# Patient Record
Sex: Female | Born: 1965 | Race: White | Hispanic: No | Marital: Married
Health system: Southern US, Community
[De-identification: ages and names within clinical notes are randomized; demographics above are authoritative.]

---

## 1998-01-11 ENCOUNTER — Inpatient Hospital Stay (HOSPITAL_COMMUNITY): Admission: AD | Admit: 1998-01-11 | Discharge: 1998-01-13 | Payer: Self-pay | Admitting: Obstetrics and Gynecology

## 1998-02-17 ENCOUNTER — Other Ambulatory Visit: Admission: RE | Admit: 1998-02-17 | Discharge: 1998-02-17 | Payer: Self-pay | Admitting: Obstetrics and Gynecology

## 1999-02-28 ENCOUNTER — Other Ambulatory Visit: Admission: RE | Admit: 1999-02-28 | Discharge: 1999-02-28 | Payer: Self-pay | Admitting: Obstetrics and Gynecology

## 2000-03-12 ENCOUNTER — Other Ambulatory Visit: Admission: RE | Admit: 2000-03-12 | Discharge: 2000-03-12 | Payer: Self-pay | Admitting: Obstetrics and Gynecology

## 2001-03-13 ENCOUNTER — Other Ambulatory Visit: Admission: RE | Admit: 2001-03-13 | Discharge: 2001-03-13 | Payer: Self-pay | Admitting: Obstetrics and Gynecology

## 2001-12-14 ENCOUNTER — Other Ambulatory Visit: Admission: RE | Admit: 2001-12-14 | Discharge: 2001-12-14 | Payer: Self-pay | Admitting: Obstetrics and Gynecology

## 2002-07-01 ENCOUNTER — Inpatient Hospital Stay (HOSPITAL_COMMUNITY): Admission: AD | Admit: 2002-07-01 | Discharge: 2002-07-03 | Payer: Self-pay | Admitting: Obstetrics and Gynecology

## 2002-08-11 ENCOUNTER — Other Ambulatory Visit: Admission: RE | Admit: 2002-08-11 | Discharge: 2002-08-11 | Payer: Self-pay | Admitting: Obstetrics and Gynecology

## 2003-08-08 ENCOUNTER — Other Ambulatory Visit: Admission: RE | Admit: 2003-08-08 | Discharge: 2003-08-08 | Payer: Self-pay | Admitting: Obstetrics and Gynecology

## 2006-09-10 ENCOUNTER — Encounter: Admission: RE | Admit: 2006-09-10 | Discharge: 2006-09-10 | Payer: Self-pay | Admitting: Obstetrics and Gynecology

## 2007-09-25 ENCOUNTER — Encounter: Admission: RE | Admit: 2007-09-25 | Discharge: 2007-09-25 | Payer: Self-pay | Admitting: Family Medicine

## 2008-09-26 ENCOUNTER — Encounter: Admission: RE | Admit: 2008-09-26 | Discharge: 2008-09-26 | Payer: Self-pay | Admitting: Family Medicine

## 2009-10-04 ENCOUNTER — Encounter: Admission: RE | Admit: 2009-10-04 | Discharge: 2009-10-04 | Payer: Self-pay | Admitting: Family Medicine

## 2010-08-17 NOTE — H&P (Signed)
   Courtney Weber, ZUMSTEIN                        ACCOUNT NO.:  [id number]   MEDICAL RECORD NO.:  [id number]                   PATIENT TYPE:  INP   LOCATION:  9174                                 FACILITY:  [facility]   PHYSICIAN:  Lenoard Aden, M.D.             DATE OF BIRTH:  01-08-1966   DATE OF ADMISSION:  07/01/2002  DATE OF DISCHARGE:                                HISTORY & PHYSICAL   CHIEF COMPLAINT:  History of shoulder dystocia and polyhydramnios for  induction.   HISTORY OF PRESENT ILLNESS:  The patient is a 45 year old white female G5,  P3, who presents for induction due to polyhydramnios, decreased fetal  movement and suspected macrosomia with a previous history of a mild shoulder  dystocia.   PAST MEDICAL HISTORY:  Remarkable for three previous vaginal deliveries in  1994, 1997 and 1999.  The patient had a mild to moderate shoulder dystocia  encountered in her 1997 delivery of an 8 pound 13 ounce child, previous  other children were 8 pounds 1 ounce and 8 pounds 9 ounces.   Otherwise her history is remarkable for ureterolithiasis at age 13 with  lithotripsy and history of advanced maternal age.  She declined  amniocentesis.   FAMILY HISTORY:  Thyroid dysfunction and adult onset diabetes.   PRENATAL LABORATORY DATA:  Blood type of A positive, Rh antibody negative.  Rubella immune.  Hepatitis B surface antigen negative.  Rubella negative.  HIV nonreactive.   PHYSICAL EXAMINATION:  GENERAL:  She is a well-developed, well-nourished  white female in no apparent distress.  HEENT:  Normal.  LUNGS:  Clear.  HEART:  Regular rhythm.  ABDOMEN:  Soft, gravid, nontender.  Estimated fetal weight is 8-1/2 to 9  pounds.  PELVIC:  Cervix is 2 cm dilated, vertex, and -2.  EXTREMITIES:  No cords.  NEUROLOGIC:  Nonfocal.   IMPRESSION:  1. Thirty-eight-week intrauterine pregnancy.  2. Presumed macrosomia with a history of shoulder dystocia.   PLAN:  Continue attempt at vaginal  delivery, IV Pitocin and amniotomy.                                               Lenoard Aden, M.D.    RJT/MEDQ  D:  07/01/2002  T:  07/01/2002  Job:  [id number]

## 2010-09-19 ENCOUNTER — Other Ambulatory Visit: Payer: Self-pay | Admitting: Family Medicine

## 2010-09-19 DIAGNOSIS — Z1231 Encounter for screening mammogram for malignant neoplasm of breast: Secondary | ICD-10-CM

## 2010-10-11 ENCOUNTER — Ambulatory Visit
Admission: RE | Admit: 2010-10-11 | Discharge: 2010-10-11 | Disposition: A | Discharge status: Home / Self Care | Payer: BC Managed Care – PPO | Source: Ambulatory Visit | Attending: Family Medicine | Admitting: Family Medicine

## 2010-10-11 DIAGNOSIS — Z1231 Encounter for screening mammogram for malignant neoplasm of breast: Secondary | ICD-10-CM

## 2011-09-17 ENCOUNTER — Other Ambulatory Visit: Payer: Self-pay | Admitting: Family Medicine

## 2011-09-17 DIAGNOSIS — Z1231 Encounter for screening mammogram for malignant neoplasm of breast: Secondary | ICD-10-CM

## 2011-10-24 ENCOUNTER — Ambulatory Visit
Admission: RE | Admit: 2011-10-24 | Discharge: 2011-10-24 | Disposition: A | Discharge status: Home / Self Care | Payer: BC Managed Care – PPO | Source: Ambulatory Visit | Attending: Family Medicine | Admitting: Family Medicine

## 2011-10-24 DIAGNOSIS — Z1231 Encounter for screening mammogram for malignant neoplasm of breast: Secondary | ICD-10-CM

## 2012-10-14 ENCOUNTER — Other Ambulatory Visit (HOSPITAL_BASED_OUTPATIENT_CLINIC_OR_DEPARTMENT_OTHER): Payer: Self-pay | Admitting: Family Medicine

## 2012-10-14 ENCOUNTER — Other Ambulatory Visit: Payer: Self-pay

## 2012-10-14 DIAGNOSIS — Z1231 Encounter for screening mammogram for malignant neoplasm of breast: Secondary | ICD-10-CM

## 2012-10-26 ENCOUNTER — Ambulatory Visit (HOSPITAL_BASED_OUTPATIENT_CLINIC_OR_DEPARTMENT_OTHER)
Admission: RE | Admit: 2012-10-26 | Discharge: 2012-10-26 | Disposition: A | Discharge status: Home / Self Care | Payer: BC Managed Care – PPO | Source: Ambulatory Visit | Attending: Family Medicine | Admitting: Family Medicine

## 2012-10-26 DIAGNOSIS — Z1231 Encounter for screening mammogram for malignant neoplasm of breast: Secondary | ICD-10-CM | POA: Insufficient documentation

## 2013-08-03 ENCOUNTER — Other Ambulatory Visit: Payer: Self-pay | Admitting: Obstetrics and Gynecology

## 2013-08-03 DIAGNOSIS — N6489 Other specified disorders of breast: Secondary | ICD-10-CM

## 2013-08-03 DIAGNOSIS — N6459 Other signs and symptoms in breast: Secondary | ICD-10-CM

## 2013-08-09 ENCOUNTER — Ambulatory Visit
Admission: RE | Admit: 2013-08-09 | Discharge: 2013-08-09 | Disposition: A | Discharge status: Home / Self Care | Payer: BC Managed Care – PPO | Source: Ambulatory Visit | Attending: Obstetrics and Gynecology | Admitting: Obstetrics and Gynecology

## 2013-08-09 DIAGNOSIS — N6489 Other specified disorders of breast: Secondary | ICD-10-CM

## 2013-08-12 ENCOUNTER — Other Ambulatory Visit: Payer: BC Managed Care – PPO

## 2013-08-18 ENCOUNTER — Other Ambulatory Visit: Payer: Self-pay

## 2013-08-18 DIAGNOSIS — Z1231 Encounter for screening mammogram for malignant neoplasm of breast: Secondary | ICD-10-CM

## 2013-11-11 ENCOUNTER — Ambulatory Visit
Admission: RE | Admit: 2013-11-11 | Discharge: 2013-11-11 | Disposition: A | Discharge status: Home / Self Care | Payer: BC Managed Care – PPO | Source: Ambulatory Visit

## 2013-11-11 ENCOUNTER — Encounter (INDEPENDENT_AMBULATORY_CARE_PROVIDER_SITE_OTHER): Payer: Self-pay

## 2013-11-11 DIAGNOSIS — Z1231 Encounter for screening mammogram for malignant neoplasm of breast: Secondary | ICD-10-CM

## 2016-01-18 IMAGING — MG MM SCREEN MAMMOGRAM BILATERAL
4 series · 4 of 4 positions shown · non-contrast
Comparison: Previous exam(s).

CLINICAL DATA: Screening. Family history of breast cancer in her
grandmother age 70.

EXAM:
DIGITAL SCREENING BILATERAL MAMMOGRAM WITH CAD

[R CC]
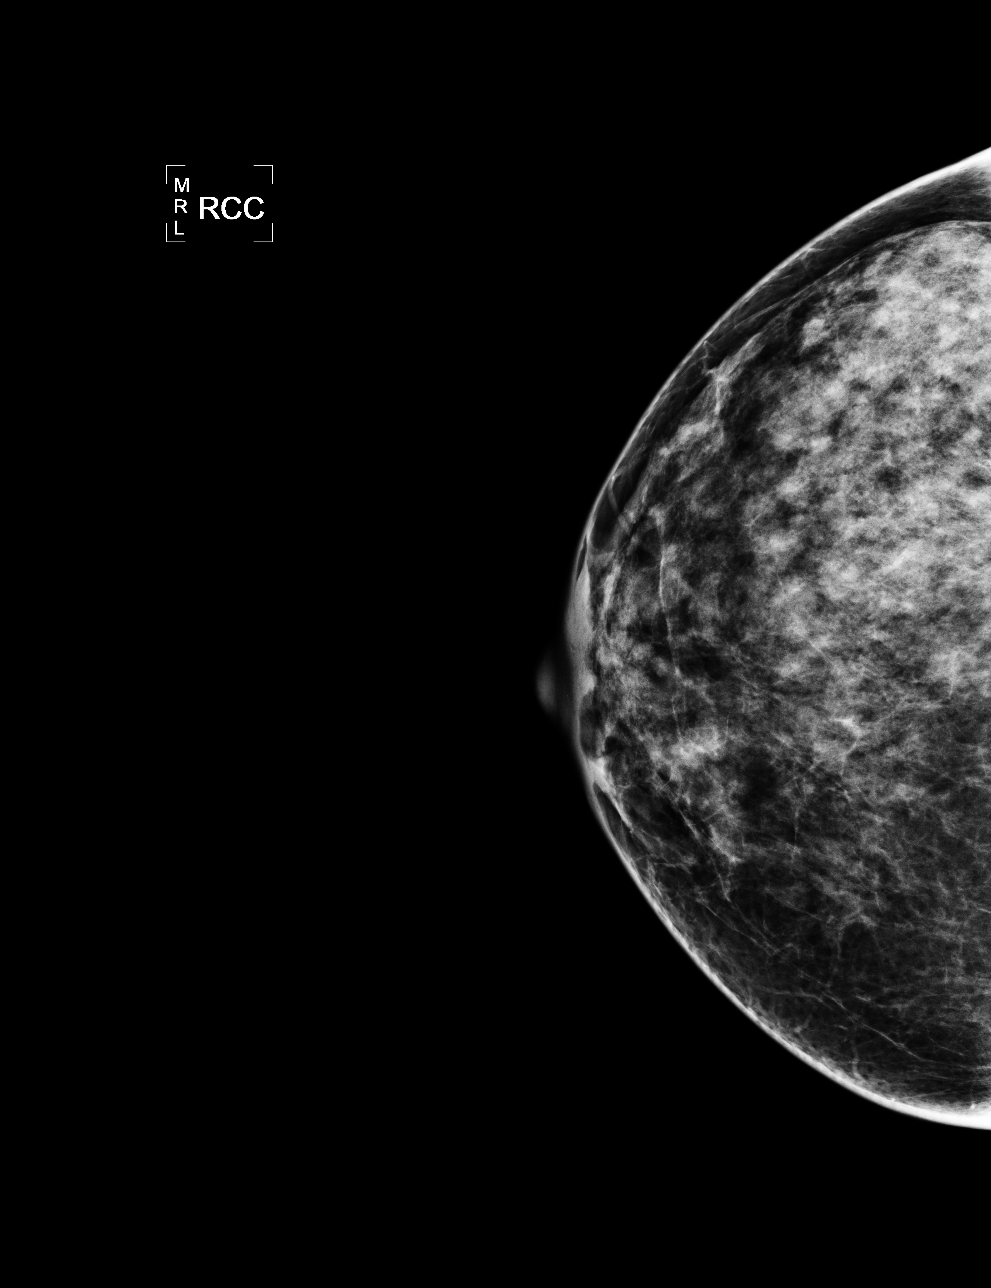

[L CC]
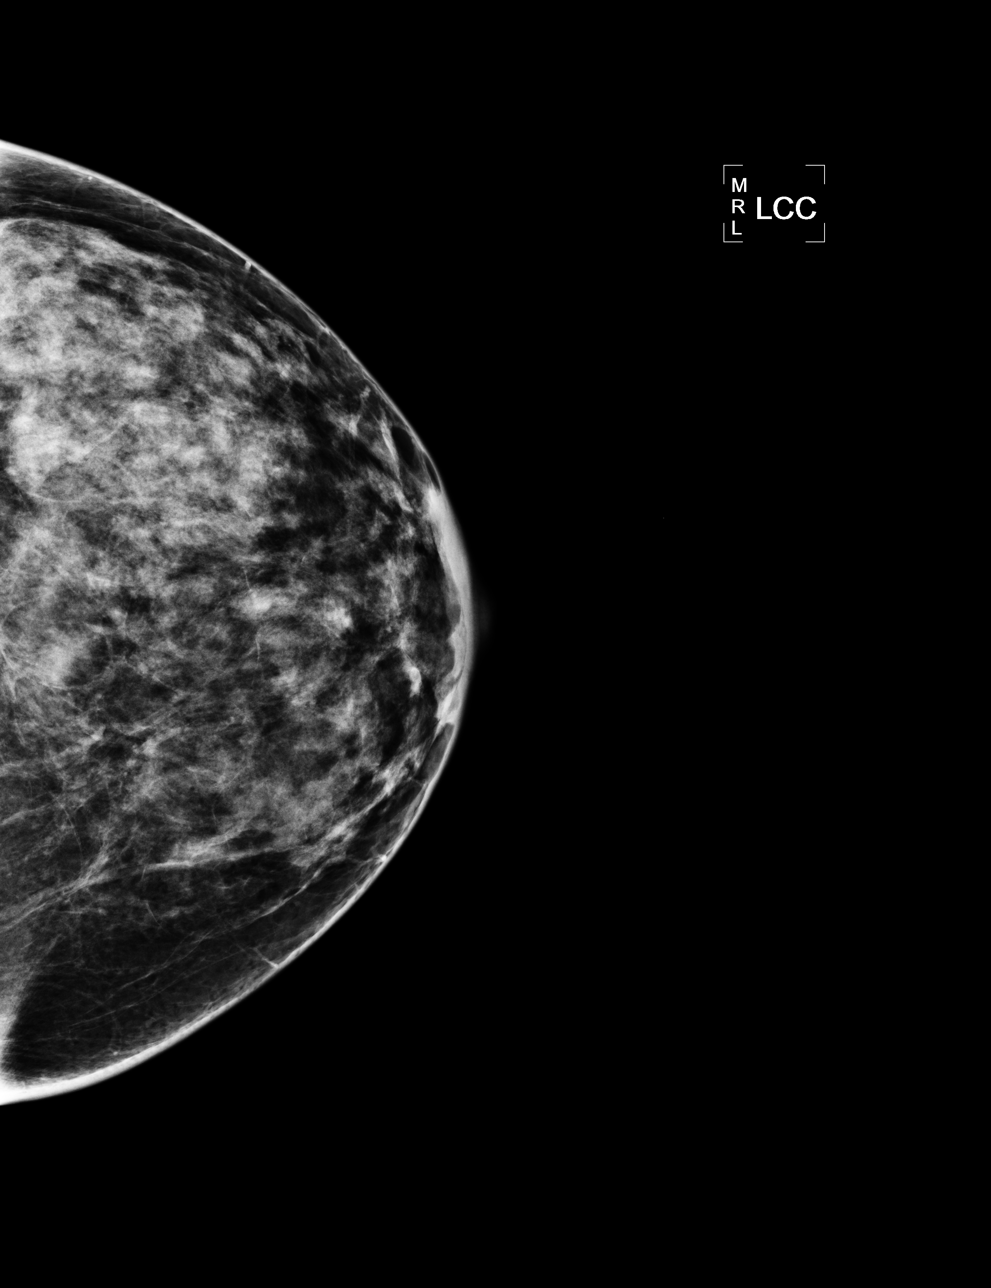

[L MLO]
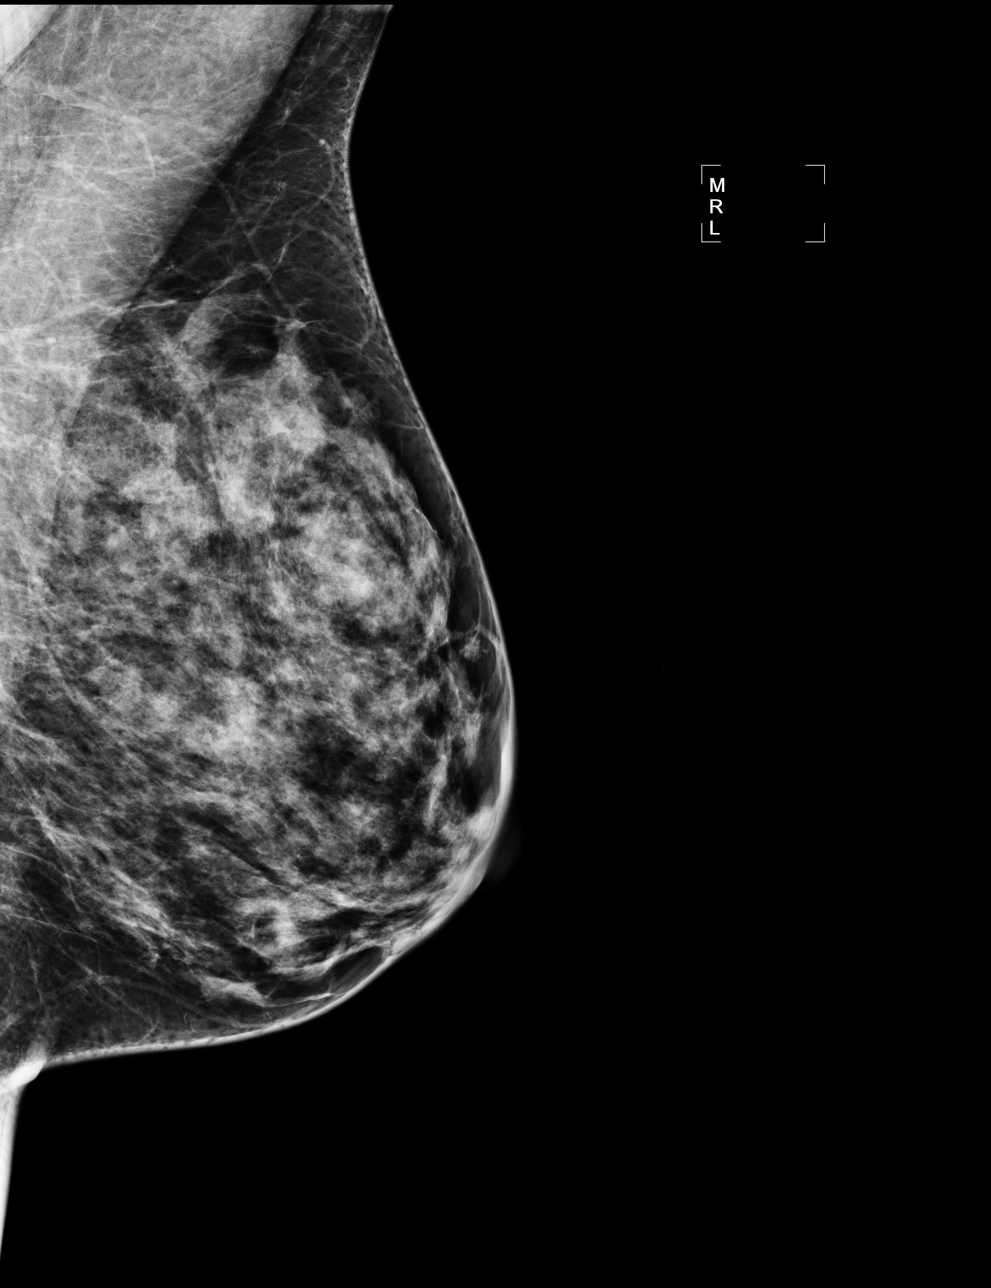

[R MLO]
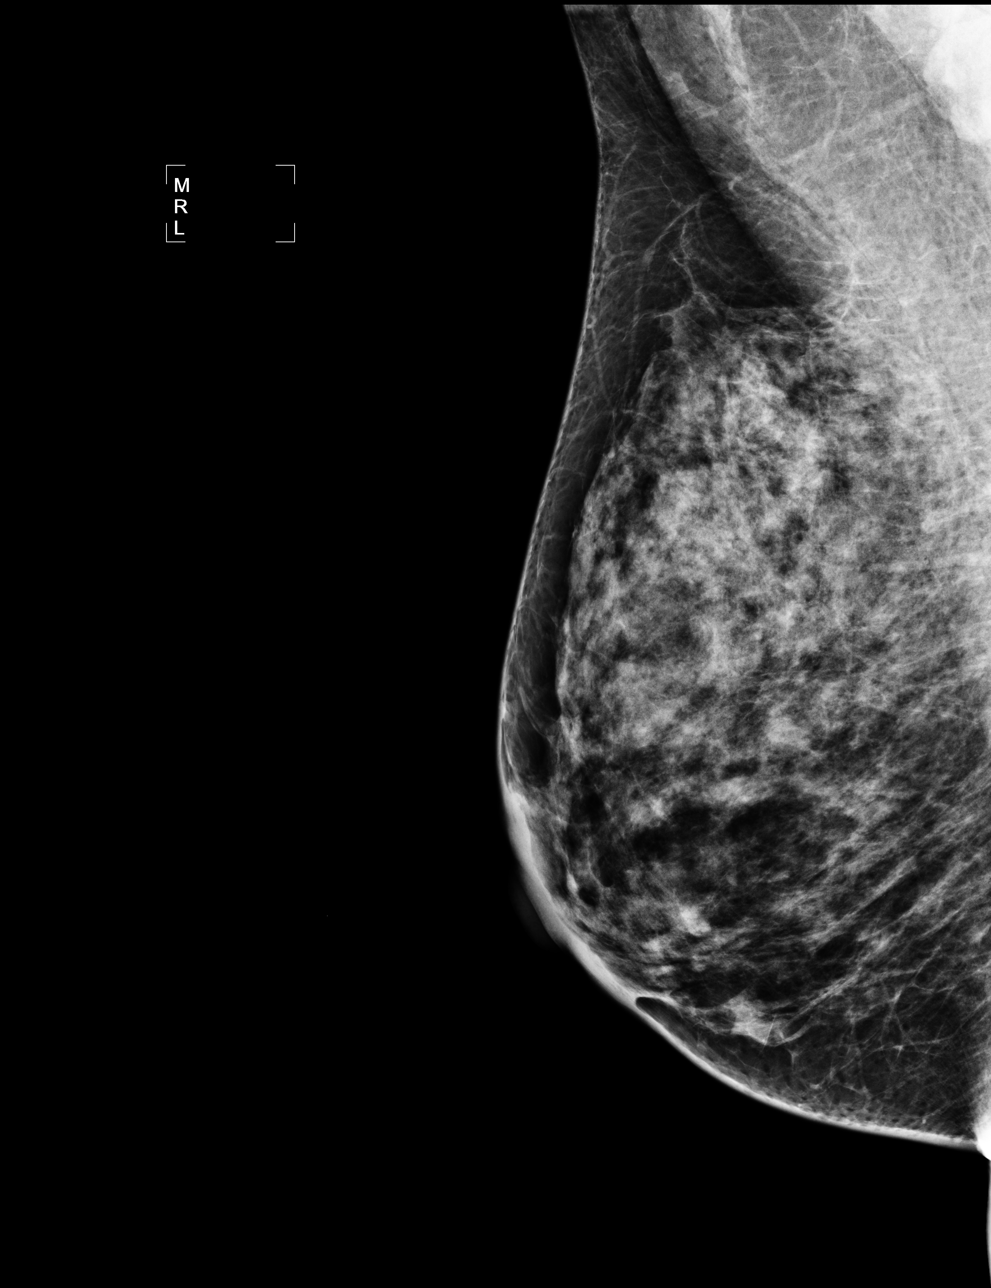

[4 of 4 positions shown; findings below may reference images not displayed]

ACR Breast Density Category c: The breast tissue is heterogeneously
dense, which may obscure small masses.
FINDINGS: There are no findings suspicious for malignancy. Images were
processed with CAD.
IMPRESSION: No mammographic evidence of malignancy. A result letter of this
screening mammogram will be mailed directly to the patient.

RECOMMENDATION:
Screening mammogram in one year. (Code:10-5-N2K)

BI-RADS CATEGORY  1: Negative.
